# Patient Record
Sex: Female | Born: 1960 | Race: Black or African American | Hispanic: No | Marital: Single | Smoking: Never smoker
Health system: Southern US, Community
[De-identification: ages and names within clinical notes are randomized; demographics above are authoritative.]

## PROBLEM LIST (undated history)

## (undated) DIAGNOSIS — I10 Essential (primary) hypertension: Secondary | ICD-10-CM

## (undated) DIAGNOSIS — F32A Depression, unspecified: Secondary | ICD-10-CM

## (undated) DIAGNOSIS — E079 Disorder of thyroid, unspecified: Secondary | ICD-10-CM

## (undated) DIAGNOSIS — F329 Major depressive disorder, single episode, unspecified: Secondary | ICD-10-CM

## (undated) HISTORY — PX: ABDOMINAL HYSTERECTOMY: SHX81

---

## 2012-01-24 ENCOUNTER — Ambulatory Visit: Payer: Self-pay | Admitting: Bariatrics

## 2012-01-24 DIAGNOSIS — I10 Essential (primary) hypertension: Secondary | ICD-10-CM

## 2012-01-24 LAB — CBC WITH DIFFERENTIAL/PLATELET
Basophil #: 0 10*3/uL (ref 0.0–0.1)
Eosinophil %: 0.9 %
HCT: 43.9 % (ref 35.0–47.0)
Lymphocyte #: 2.4 10*3/uL (ref 1.0–3.6)
Lymphocyte %: 37.3 %
MCHC: 31.2 g/dL — ABNORMAL LOW (ref 32.0–36.0)
MCV: 87 fL (ref 80–100)
Monocyte %: 4.7 %
Neutrophil #: 3.6 10*3/uL (ref 1.4–6.5)
Neutrophil %: 56.5 %
RBC: 5.07 10*6/uL (ref 3.80–5.20)
RDW: 15 % — ABNORMAL HIGH (ref 11.5–14.5)
WBC: 6.4 10*3/uL (ref 3.6–11.0)

## 2012-01-24 LAB — IRON AND TIBC
Iron Bind.Cap.(Total): 326 ug/dL (ref 250–450)
Iron Saturation: 25 %
Iron: 83 ug/dL (ref 50–170)
Unbound Iron-Bind.Cap.: 243 ug/dL

## 2012-01-24 LAB — LIPASE, BLOOD: Lipase: 159 U/L (ref 73–393)

## 2012-01-24 LAB — COMPREHENSIVE METABOLIC PANEL
Anion Gap: 9 (ref 7–16)
Calcium, Total: 9.4 mg/dL (ref 8.5–10.1)
Chloride: 102 mmol/L (ref 98–107)
Co2: 30 mmol/L (ref 21–32)
EGFR (African American): >60
EGFR (Non-African Amer.): >60
Glucose: 103 mg/dL — ABNORMAL HIGH (ref 65–99)
Osmolality: 282 (ref 275–301)
Potassium: 3.3 mmol/L — ABNORMAL LOW (ref 3.5–5.1)
SGOT(AST): 21 U/L (ref 15–37)
Sodium: 141 mmol/L (ref 136–145)

## 2012-01-24 LAB — TSH: Thyroid Stimulating Horm: 7.64 u[IU]/mL — ABNORMAL HIGH

## 2012-01-24 LAB — PHOSPHORUS: Phosphorus: 2.6 mg/dL (ref 2.5–4.9)

## 2012-01-24 LAB — BILIRUBIN, DIRECT: Bilirubin, Direct: <0.1 mg/dL (ref 0.00–0.20)

## 2012-01-24 LAB — PROTIME-INR
INR: 0.9
Prothrombin Time: 12.2 secs (ref 11.5–14.7)

## 2012-01-24 LAB — APTT: Activated PTT: 31 secs (ref 23.6–35.9)

## 2012-01-25 LAB — FOLATE: Folic Acid: 15.8 ng/mL (ref 3.1–100.0)

## 2012-05-03 ENCOUNTER — Ambulatory Visit: Payer: Self-pay | Admitting: Bariatrics

## 2012-06-02 ENCOUNTER — Ambulatory Visit: Payer: Self-pay | Admitting: Bariatrics

## 2012-07-12 ENCOUNTER — Ambulatory Visit: Payer: Self-pay | Admitting: Bariatrics

## 2012-08-03 ENCOUNTER — Ambulatory Visit: Payer: Self-pay | Admitting: Bariatrics

## 2012-09-16 ENCOUNTER — Ambulatory Visit: Payer: Self-pay | Admitting: Bariatrics

## 2012-10-01 ENCOUNTER — Ambulatory Visit: Payer: Self-pay | Admitting: Bariatrics

## 2013-01-14 ENCOUNTER — Ambulatory Visit: Payer: Self-pay

## 2013-05-16 IMAGING — US ABDOMEN ULTRASOUND LIMITED
1 series · 14 of 25 positions shown · non-contrast
Comparison: none

REASON FOR EXAM: hypertension type 2 diabetes thyroid diisorder anxiety
depression lumbago in...
COMMENTS:

PROCEDURE:     US  - US ABDOMEN LIMITED SURVEY  - January 24, 2012  [DATE]
RESULT:     Comparison: None
TECHNIQUE: Multiple gray-scale and color-flow Doppler images of the right
upper quadrant are presented for review.

[Series 1: abdomen ultrasound limited · 0.31mm/px · 14 of 58 slices shown]
[im 1/58]
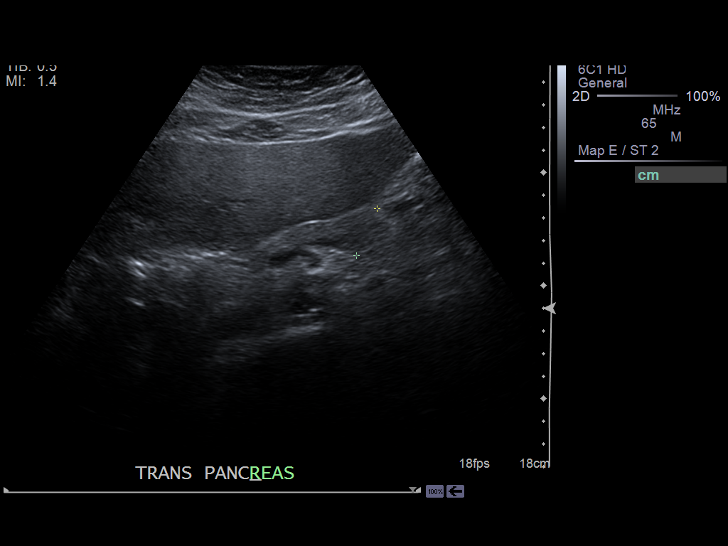
[im 5/58]
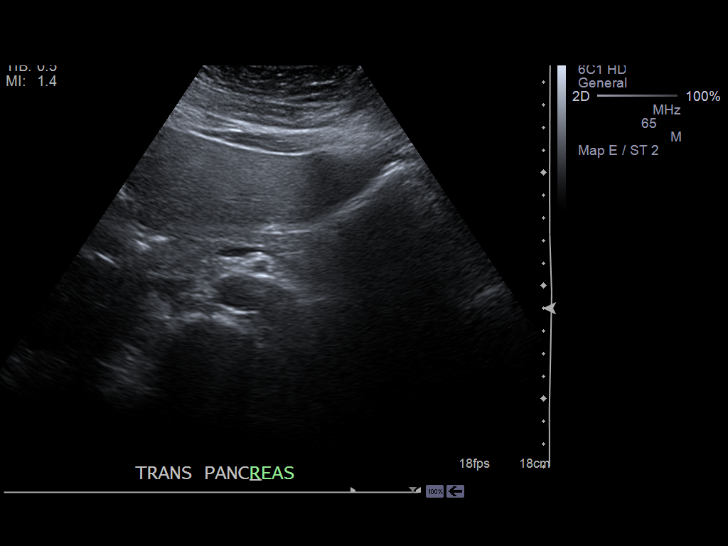
[im 10/58]
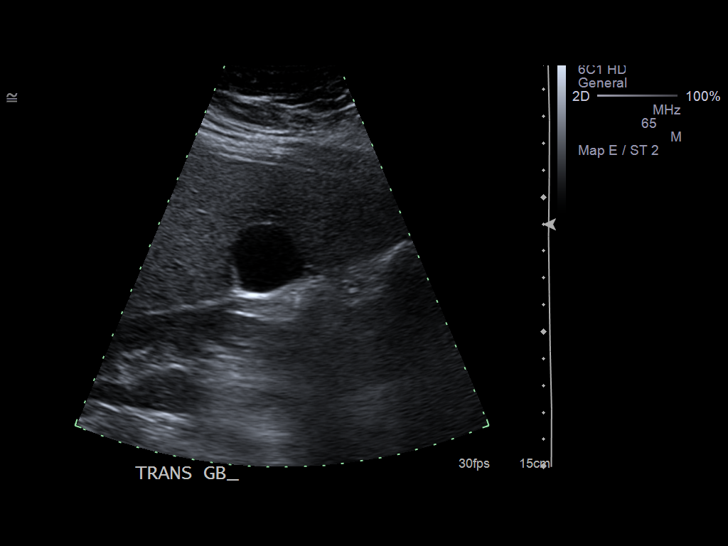
[im 15/58]
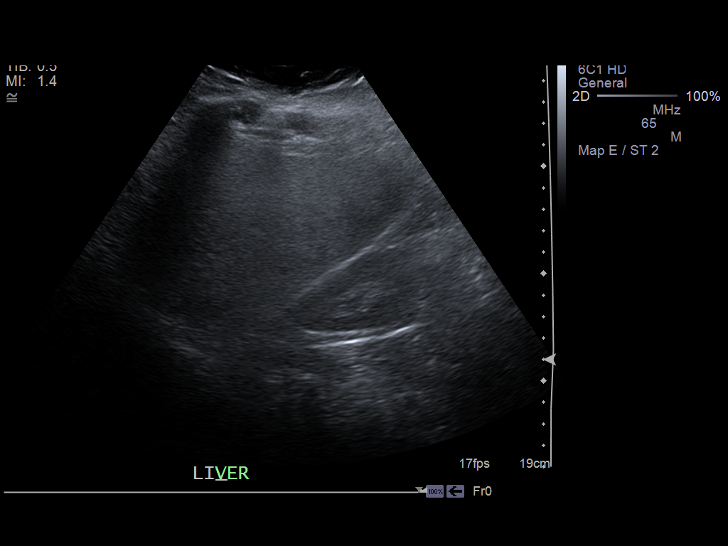
[im 20/58]
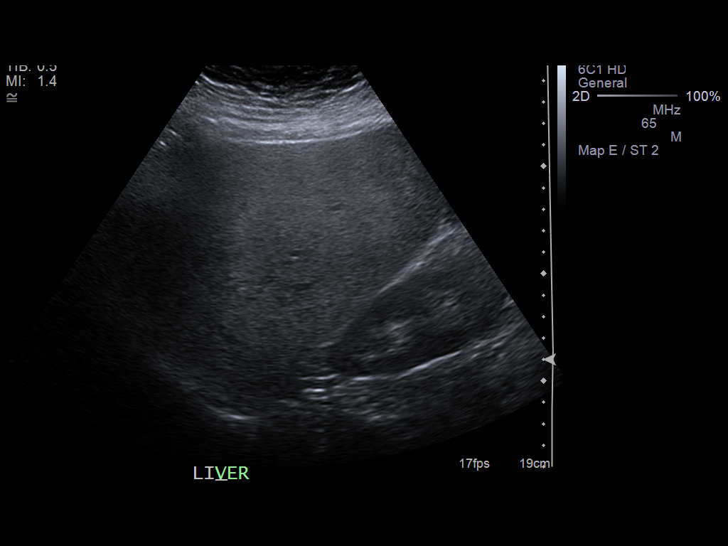
[im 22/58]
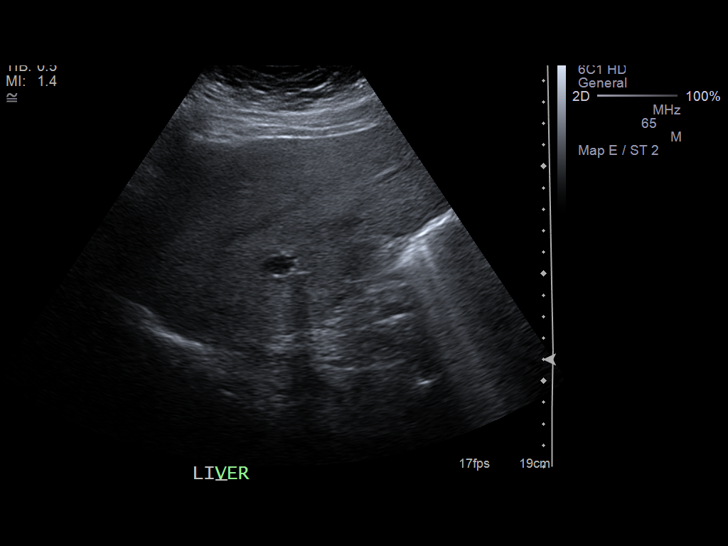
[im 27/58]
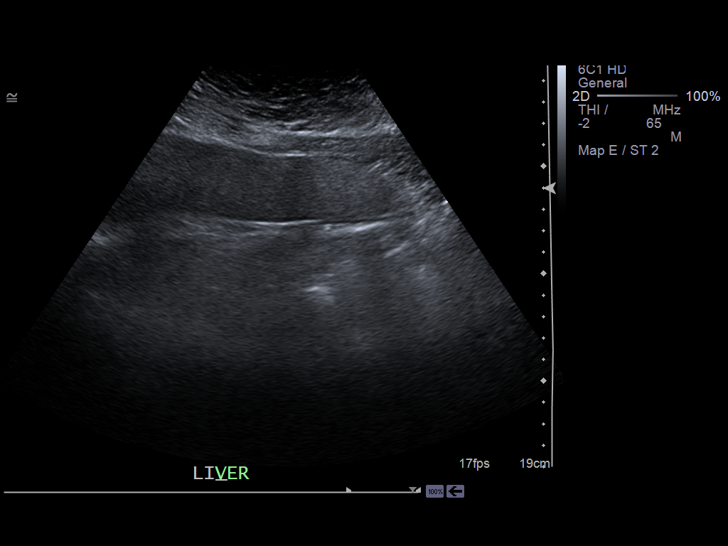
[im 31/58]
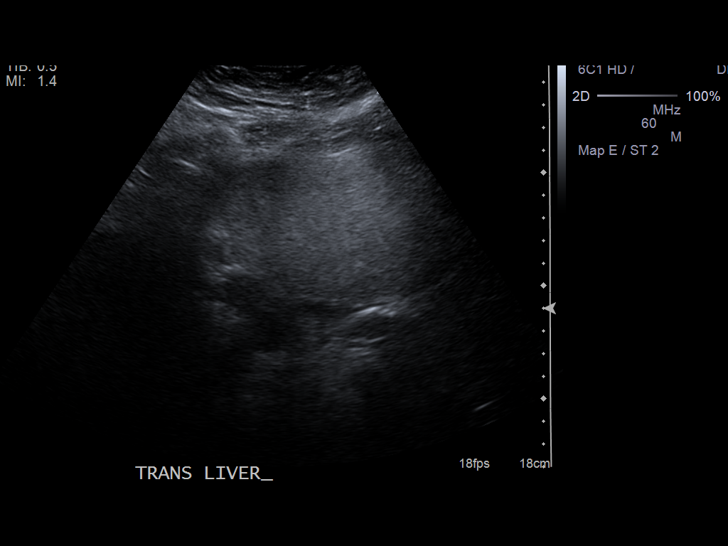
[im 36/58]
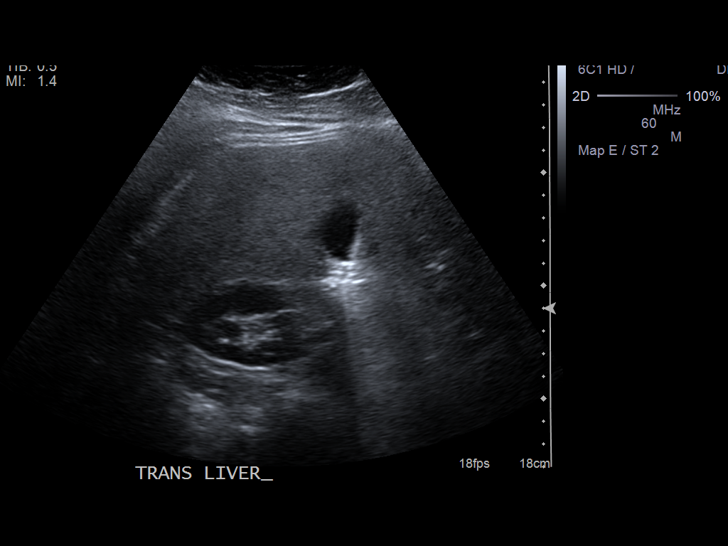
[im 39/58]
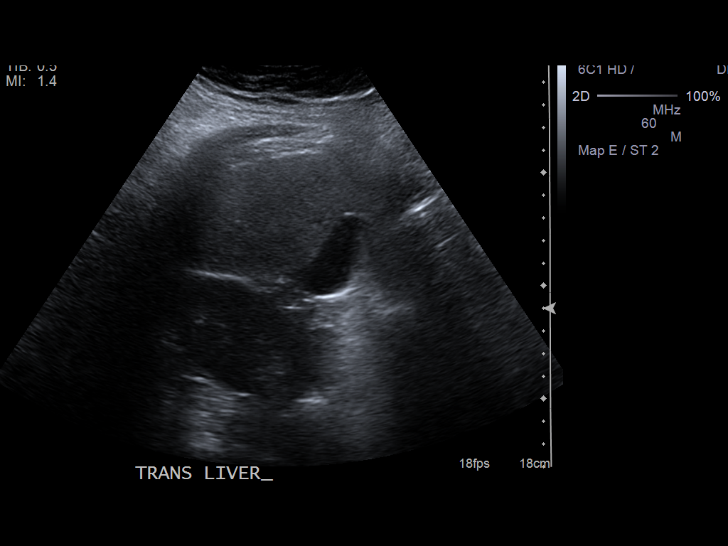
[im 43/58]
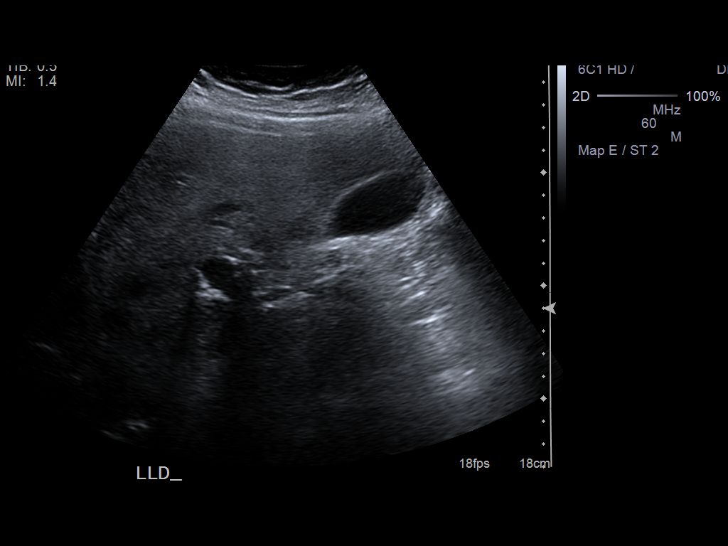
[im 48/58]
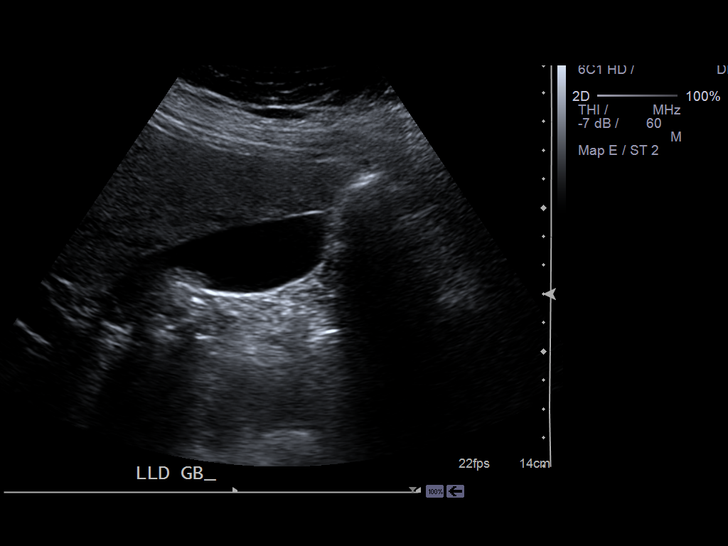
[im 53/58]
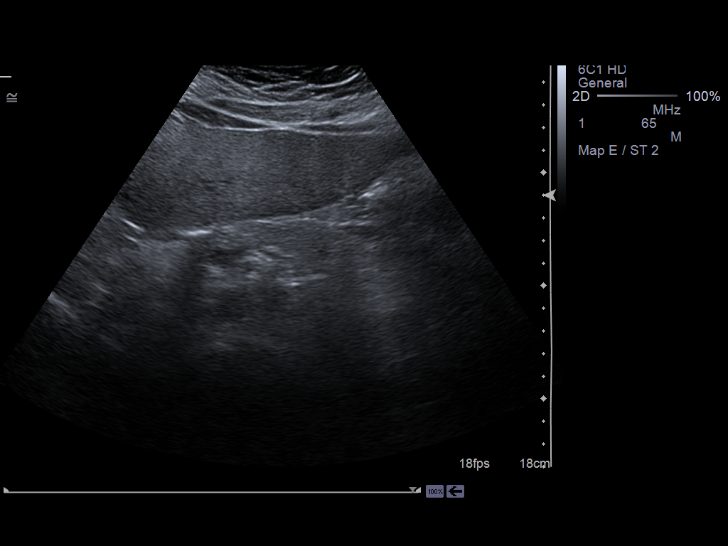
[im 58/58]
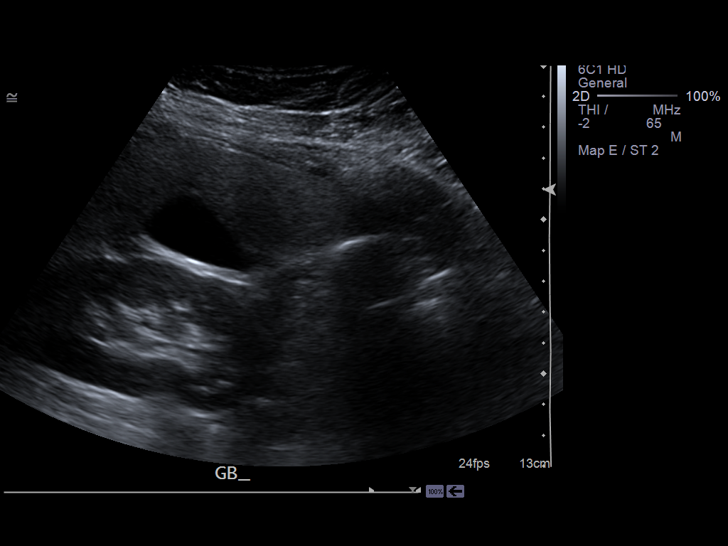

[14 of 25 positions shown; findings below may reference images not displayed]

FINDINGS: Visualized portions of the liver demonstrate mildly increased echogenicity
as can be seen with hepatic steatosis. The liver is without evidence of a
focal hepatic lesion.

There is no cholelithiasis or biliary sludge. There is no intra- or
extrahepatic biliary ductal dilatation. The common duct measures 4.1 mm in
maximal diameter. There is no gallbladder wall thickening, pericholecystic
fluid, or sonographic Murphy's sign.

The visualized portion of the pancreas is normal in echogenicity.
IMPRESSION: No cholelithiasis or sonographic evidence of acute cholecystitis.

Hepatic steatosis.

[REDACTED]

## 2016-06-30 ENCOUNTER — Ambulatory Visit
Admission: EM | Admit: 2016-06-30 | Discharge: 2016-06-30 | Disposition: A | Discharge status: Home / Self Care | Payer: BC Managed Care – PPO | Attending: Family Medicine | Admitting: Family Medicine

## 2016-06-30 DIAGNOSIS — J069 Acute upper respiratory infection, unspecified: Secondary | ICD-10-CM | POA: Diagnosis not present

## 2016-06-30 HISTORY — DX: Depression, unspecified: F32.A

## 2016-06-30 HISTORY — DX: Major depressive disorder, single episode, unspecified: F32.9

## 2016-06-30 HISTORY — DX: Disorder of thyroid, unspecified: E07.9

## 2016-06-30 HISTORY — DX: Essential (primary) hypertension: I10

## 2016-06-30 NOTE — ED Triage Notes (Addendum)
Pt c/o sore throat, sneezing, coughing and fever since Thursday. Scratchy eyes and hoarse

## 2016-06-30 NOTE — ED Provider Notes (Signed)
CSN: [id number]     Arrival date & time 06/30/16  1407 History   First MD Initiated Contact with Patient 06/30/16 1643     Chief Complaint  Patient presents with  . URI   (Consider location/radiation/quality/duration/timing/severity/associated sxs/prior Treatment) HPI  This is a 55 year old female presents with the onset 2 days ago with sore throat sneezing coughing and fever. She's also had scratchy eyes and hoarseness. 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 Temperature today was 99.5 blood pressure 143/70 respirations 18 pulse 65 and O2 sat of 97% on room air Past Medical History:  Diagnosis Date  . Depression   . Hypertension   . Thyroid disease    Past Surgical History:  Procedure Laterality Date  . ABDOMINAL HYSTERECTOMY     Family History  Problem Relation Age of Onset  . Asthma Mother   . Hypertension Mother   . Thyroid disease Mother   . Cancer Father    Social History  Substance Use Topics  . Smoking status: Never Smoker  . Smokeless tobacco: Never Used  . Alcohol use No   OB History    No data available     Review of Systems  Constitutional: Positive for activity change, chills, fatigue and fever.  HENT: Positive for congestion, postnasal drip, rhinorrhea, sinus pressure, sneezing, sore throat and voice change.   Respiratory: Positive for cough. Negative for shortness of breath, wheezing and stridor.   All other systems reviewed and are negative.   Allergies  Patient has no known allergies.  Home Medications   Prior to Admission medications   Medication Sig Start Date End Date Taking? Authorizing Provider  buPROPion (WELLBUTRIN) 100 MG tablet Take 100 mg by mouth 2 (two) times daily.   Yes Historical Provider, MD  citalopram (CELEXA) 10 MG tablet Take 10 mg by mouth daily.   Yes Historical Provider, MD  clonazePAM (KLONOPIN) 0.5 MG tablet Take 0.5 mg by mouth 2 (two) times daily as needed for anxiety.   Yes  Historical Provider, MD  hydrochlorothiazide (HYDRODIURIL) 25 MG tablet Take 25 mg by mouth daily.   Yes Historical Provider, MD  levothyroxine (SYNTHROID, LEVOTHROID) 112 MCG tablet Take 112 mcg by mouth daily before breakfast.   Yes Historical Provider, MD   Meds Ordered and Administered this Visit  Medications - No data to display  BP (!) 143/70 (BP Location: Left Arm)   Pulse 65   Temp 99.5 F (37.5 C) (Oral)   Resp 18   Ht 5\' 3"  (1.6 m)   Wt 218 lb (98.9 kg)   SpO2 97%   BMI 38.62 kg/m  No data found.   Physical Exam  Constitutional: She is oriented to person, place, and time. She appears well-developed and well-nourished. No distress.  HENT:  Head: Normocephalic and atraumatic.  Right Ear: External ear normal.  Left Ear: External ear normal.  Nose: Nose normal.  Mouth/Throat: Oropharynx is clear and moist. No oropharyngeal exudate.  Eyes: EOM are normal. Pupils are equal, round, and reactive to light.  Neck: Normal range of motion. Neck supple.  Pulmonary/Chest: Effort normal and breath sounds normal. No respiratory distress. She has no wheezes. She has no rales.  Musculoskeletal: Normal range of motion.  Lymphadenopathy:    She has no cervical adenopathy.  Neurological: She is alert and oriented to person, place, and time.  Skin: Skin is warm and dry. She is not diaphoretic.  Psychiatric: She has a normal mood and  affect. Her behavior is normal. Judgment and thought content normal.  Nursing note and vitals reviewed.   Urgent Care Course   Clinical Course     Procedures (including critical care time)  Labs Review Labs Reviewed - No data to display  Imaging Review No results found.   Visual Acuity Review  Right Eye Distance:   Left Eye Distance:   Bilateral Distance:    Right Eye Near:   Left Eye Near:    Bilateral Near:         MDM   1. Upper respiratory tract infection, unspecified type    Discharge Medication List as of 06/30/2016  4:59  PM    Plan: 1. Test/x-ray results and diagnosis reviewed with patient 2. rx as per orders; risks, benefits, potential side effects reviewed with patient 3. Recommend supportive treatment with Fluids and rest. Use Tylenol or Motrin for body aches fever. I've told her that this is most likely a virus and does not require any antibiotics. She has been using Flonase and I've encouraged her to continue its use to promote drainage. I will keep her out of work for 2 days Saturday and Sunday lowered to return to work on Monday if she is afebrile 4. F/u prn if symptoms worsen or don't improve     Lutricia FeilWilliam P Raelie Lohr, PA-C 06/30/16 1705
